# Patient Record
Sex: Male | Born: 2005 | Hispanic: Yes | Marital: Single | Smoking: Never smoker
Health system: Southern US, Community
[De-identification: ages and names within clinical notes are randomized; demographics above are authoritative.]

## PROBLEM LIST (undated history)

## (undated) DIAGNOSIS — R569 Unspecified convulsions: Secondary | ICD-10-CM

## (undated) DIAGNOSIS — A419 Sepsis, unspecified organism: Secondary | ICD-10-CM

## (undated) DIAGNOSIS — N189 Chronic kidney disease, unspecified: Secondary | ICD-10-CM

## (undated) DIAGNOSIS — D65 Disseminated intravascular coagulation [defibrination syndrome]: Secondary | ICD-10-CM

## (undated) DIAGNOSIS — Q627 Congenital vesico-uretero-renal reflux: Secondary | ICD-10-CM

## (undated) HISTORY — DX: Unspecified convulsions: R56.9

## (undated) HISTORY — PX: SKIN GRAFT: SHX250

## (undated) HISTORY — DX: Chronic kidney disease, unspecified: N18.9

## (undated) HISTORY — DX: Disseminated intravascular coagulation (defibrination syndrome): D65

---
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
## 2023-02-05 ENCOUNTER — Other Ambulatory Visit: Payer: Self-pay

## 2023-02-05 ENCOUNTER — Other Ambulatory Visit (HOSPITAL_BASED_OUTPATIENT_CLINIC_OR_DEPARTMENT_OTHER): Payer: Self-pay

## 2023-02-05 ENCOUNTER — Emergency Department (HOSPITAL_BASED_OUTPATIENT_CLINIC_OR_DEPARTMENT_OTHER): Payer: Medicaid Other

## 2023-02-05 ENCOUNTER — Emergency Department (HOSPITAL_BASED_OUTPATIENT_CLINIC_OR_DEPARTMENT_OTHER)
Admission: EM | Admit: 2023-02-05 | Discharge: 2023-02-05 | Disposition: A | Discharge status: Home / Self Care | Payer: Medicaid Other | Attending: Emergency Medicine | Admitting: Emergency Medicine

## 2023-02-05 ENCOUNTER — Encounter (HOSPITAL_BASED_OUTPATIENT_CLINIC_OR_DEPARTMENT_OTHER): Payer: Self-pay | Admitting: Emergency Medicine

## 2023-02-05 DIAGNOSIS — N189 Chronic kidney disease, unspecified: Secondary | ICD-10-CM | POA: Insufficient documentation

## 2023-02-05 DIAGNOSIS — R1110 Vomiting, unspecified: Secondary | ICD-10-CM | POA: Diagnosis not present

## 2023-02-05 DIAGNOSIS — R109 Unspecified abdominal pain: Secondary | ICD-10-CM | POA: Diagnosis not present

## 2023-02-05 DIAGNOSIS — K529 Noninfective gastroenteritis and colitis, unspecified: Secondary | ICD-10-CM

## 2023-02-05 DIAGNOSIS — R112 Nausea with vomiting, unspecified: Secondary | ICD-10-CM | POA: Diagnosis present

## 2023-02-05 LAB — URINALYSIS, ROUTINE W REFLEX MICROSCOPIC
Bilirubin Urine: NEGATIVE
Glucose, UA: NEGATIVE mg/dL
Hgb urine dipstick: NEGATIVE
Ketones, ur: NEGATIVE mg/dL
Leukocytes,Ua: NEGATIVE
Nitrite: NEGATIVE
Protein, ur: 30 mg/dL — AB
Specific Gravity, Urine: 1.02 (ref 1.005–1.030)
pH: 7 (ref 5.0–8.0)

## 2023-02-05 LAB — CBC WITH DIFFERENTIAL/PLATELET
Abs Immature Granulocytes: 0.03 10*3/uL (ref 0.00–0.07)
Basophils Absolute: 0 10*3/uL (ref 0.0–0.1)
Basophils Relative: 0 %
Eosinophils Absolute: 0.1 10*3/uL (ref 0.0–0.5)
Eosinophils Relative: 1 %
HCT: 49.5 % (ref 39.0–52.0)
Hemoglobin: 16.4 g/dL (ref 13.0–17.0)
Immature Granulocytes: 0 %
Lymphocytes Relative: 3 %
Lymphs Abs: 0.3 10*3/uL — ABNORMAL LOW (ref 0.7–4.0)
MCH: 29.1 pg (ref 26.0–34.0)
MCHC: 33.1 g/dL (ref 30.0–36.0)
MCV: 87.9 fL (ref 80.0–100.0)
Monocytes Absolute: 0.6 10*3/uL (ref 0.1–1.0)
Monocytes Relative: 5 %
Neutro Abs: 11.3 10*3/uL — ABNORMAL HIGH (ref 1.7–7.7)
Neutrophils Relative %: 91 %
Platelets: 230 10*3/uL (ref 150–400)
RBC: 5.63 MIL/uL (ref 4.22–5.81)
RDW: 13.5 % (ref 11.5–15.5)
WBC: 12.3 10*3/uL — ABNORMAL HIGH (ref 4.0–10.5)
nRBC: 0 % (ref 0.0–0.2)

## 2023-02-05 LAB — COMPREHENSIVE METABOLIC PANEL
ALT: 38 U/L (ref 0–44)
AST: 19 U/L (ref 15–41)
Albumin: 5.1 g/dL — ABNORMAL HIGH (ref 3.5–5.0)
Alkaline Phosphatase: 81 U/L (ref 38–126)
Anion gap: 11 (ref 5–15)
BUN: 21 mg/dL — ABNORMAL HIGH (ref 6–20)
CO2: 25 mmol/L (ref 22–32)
Calcium: 10.3 mg/dL (ref 8.9–10.3)
Chloride: 103 mmol/L (ref 98–111)
Creatinine, Ser: 1.06 mg/dL (ref 0.61–1.24)
GFR, Estimated: >60 mL/min (ref >60)
Glucose, Bld: 152 mg/dL — ABNORMAL HIGH (ref 70–99)
Potassium: 4 mmol/L (ref 3.5–5.1)
Sodium: 139 mmol/L (ref 135–145)
Total Bilirubin: 0.7 mg/dL (ref 0.3–1.2)
Total Protein: 8.9 g/dL — ABNORMAL HIGH (ref 6.5–8.1)

## 2023-02-05 LAB — URINALYSIS, MICROSCOPIC (REFLEX)

## 2023-02-05 LAB — LIPASE, BLOOD: Lipase: <10 U/L — ABNORMAL LOW (ref 11–51)

## 2023-02-05 MED ORDER — KETOROLAC TROMETHAMINE 15 MG/ML IJ SOLN
15.0000 mg | Freq: Once | INTRAMUSCULAR | Status: AC
  Administered 2023-02-05: 15 mg via INTRAVENOUS
  Filled 2023-02-05: qty 1

## 2023-02-05 MED ORDER — ONDANSETRON HCL 4 MG/2ML IJ SOLN
4.0000 mg | Freq: Once | INTRAMUSCULAR | Status: AC
  Administered 2023-02-05: 4 mg via INTRAVENOUS
  Filled 2023-02-05: qty 2

## 2023-02-05 MED ORDER — ONDANSETRON HCL 4 MG PO TABS: 4.0000 mg | ORAL_TABLET | Freq: Four times a day (QID) | ORAL | 0 refills | Status: DC

## 2023-02-05 MED ORDER — SODIUM CHLORIDE 0.9 % IV BOLUS
1000.0000 mL | Freq: Once | INTRAVENOUS | Status: AC
  Administered 2023-02-05: 1000 mL via INTRAVENOUS

## 2023-02-05 MED ORDER — ONDANSETRON HCL 4 MG PO TABS
4.0000 mg | ORAL_TABLET | Freq: Four times a day (QID) | ORAL | 0 refills | Status: DC
  Filled 2023-02-05: qty 12, 3d supply, fill #0

## 2023-02-05 MED ORDER — IOHEXOL 300 MG/ML  SOLN
100.0000 mL | Freq: Once | INTRAMUSCULAR | Status: AC | PRN
  Administered 2023-02-05: 85 mL via INTRAVENOUS

## 2023-02-05 MED ORDER — MORPHINE SULFATE (PF) 4 MG/ML IV SOLN
4.0000 mg | Freq: Once | INTRAVENOUS | Status: DC
  Filled 2023-02-05: qty 1

## 2023-02-05 NOTE — ED Notes (Signed)
Patient transported to CT 
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
## 2023-02-05 NOTE — Discharge Instructions (Signed)
We evaluated you for your nausea, vomiting, diarrhea.  Your laboratory testing and CT scan did not show any dangerous process.  Your symptoms improved with IV fluids and nausea medication.  Your symptoms are most likely due to a viral illness.  I have prescribed you nausea medication to take at home.  Please take this every 6 hours as needed for nausea or vomiting.  You can also buy Imodium over-the-counter to help with your diarrhea.  Please be sure to drink a lot of fluids.  Please return to the emergency department if you are unable to keep down fluids, you develop worsening pain, high fevers, bloody stools, bloody vomit, or any other concerning symptoms.

## 2023-02-05 NOTE — ED Notes (Signed)
Dc instructions reviewed with patient. Patient voiced understanding. Dc with belongings.  °

## 2023-02-05 NOTE — ED Triage Notes (Signed)
Pt has been vomiting since 10pm last night- unable to keep any meds down. Epigastric pain and Diarrhea.

## 2024-02-10 ENCOUNTER — Encounter (HOSPITAL_BASED_OUTPATIENT_CLINIC_OR_DEPARTMENT_OTHER): Payer: Self-pay | Admitting: Family Medicine

## 2024-02-10 ENCOUNTER — Ambulatory Visit (INDEPENDENT_AMBULATORY_CARE_PROVIDER_SITE_OTHER): Payer: Medicaid Other | Admitting: Family Medicine

## 2024-02-10 VITALS — BP 133/81 | HR 81 | Ht 67.0 in | Wt 266.4 lb

## 2024-02-10 DIAGNOSIS — Z0000 Encounter for general adult medical examination without abnormal findings: Secondary | ICD-10-CM

## 2024-02-10 DIAGNOSIS — E559 Vitamin D deficiency, unspecified: Secondary | ICD-10-CM | POA: Diagnosis not present

## 2024-02-10 NOTE — Patient Instructions (Addendum)
  Medication Instructions:  Your physician recommends that you continue on your current medications as directed. Please refer to the Current Medication list given to you today. --If you need a refill on any your medications before your next appointment, please call your pharmacy first. If no refills are authorized on file call the office.-- Lab Work: Your physician has recommended that you have lab work today: 1 week before the next visit  If you have labs (blood work) drawn today and your tests are completely normal, you will receive your results via MyChart message OR a phone call from our staff.  Please ensure you check your voicemail in the event that you authorized detailed messages to be left on a delegated number. If you have any lab test that is abnormal or we need to change your treatment, we will call you to review the results.  Follow-Up: Your next appointment:   Your physician recommends that you schedule a follow-up appointment in: 1-2 month physical  with Dr. de [place]  You will receive a text message or e-mail with a link to a survey about your care and experience with [place] today! We would greatly appreciate your feedback!   Thanks for letting us be apart of your health journey!!  Primary Care and Sports Medicine   Dr. Ceasar Mons [place]   We encourage you to activate your patient portal called "MyChart".  Sign up information is provided on this After Visit Summary.  MyChart is used to connect with patients for Virtual Visits (Telemedicine).  Patients are able to view lab/test results, encounter notes, upcoming appointments, etc.  Non-urgent messages can be sent to your provider as well. To learn more about what you can do with MyChart, please visit --  [email or web address].
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
## 2024-02-10 NOTE — Assessment & Plan Note (Signed)
 Observed on prior labs.  Can monitor this on upcoming labs in order to reassess

## 2024-04-08 ENCOUNTER — Other Ambulatory Visit (HOSPITAL_BASED_OUTPATIENT_CLINIC_OR_DEPARTMENT_OTHER)

## 2024-04-08 DIAGNOSIS — E559 Vitamin D deficiency, unspecified: Secondary | ICD-10-CM

## 2024-04-08 DIAGNOSIS — Z0000 Encounter for general adult medical examination without abnormal findings: Secondary | ICD-10-CM | POA: Diagnosis not present

## 2024-04-09 LAB — COMPREHENSIVE METABOLIC PANEL WITH GFR
ALT: 40 IU/L (ref 0–44)
AST: 26 IU/L (ref 0–40)
Albumin: 4.9 g/dL (ref 4.3–5.2)
Alkaline Phosphatase: 83 IU/L (ref 51–125)
BUN/Creatinine Ratio: 16 (ref 9–20)
BUN: 16 mg/dL (ref 6–20)
Bilirubin Total: 0.7 mg/dL (ref 0.0–1.2)
CO2: 23 mmol/L (ref 20–29)
Calcium: 10.2 mg/dL (ref 8.7–10.2)
Chloride: 100 mmol/L (ref 96–106)
Creatinine, Ser: 0.98 mg/dL (ref 0.76–1.27)
Globulin, Total: 2.7 g/dL (ref 1.5–4.5)
Glucose: 87 mg/dL (ref 70–99)
Potassium: 3.9 mmol/L (ref 3.5–5.2)
Sodium: 139 mmol/L (ref 134–144)
Total Protein: 7.6 g/dL (ref 6.0–8.5)
eGFR: 114 mL/min/{1.73_m2} (ref >59)

## 2024-04-09 LAB — LIPID PANEL
Chol/HDL Ratio: 4.1 ratio (ref 0.0–5.0)
Cholesterol, Total: 171 mg/dL — ABNORMAL HIGH (ref 100–169)
HDL: 42 mg/dL (ref >39)
LDL Chol Calc (NIH): 115 mg/dL — ABNORMAL HIGH (ref 0–109)
Triglycerides: 75 mg/dL (ref 0–89)
VLDL Cholesterol Cal: 14 mg/dL (ref 5–40)

## 2024-04-09 LAB — CBC WITH DIFFERENTIAL/PLATELET
Basophils Absolute: 0 10*3/uL (ref 0.0–0.2)
Basos: 0 %
EOS (ABSOLUTE): 0.1 10*3/uL (ref 0.0–0.4)
Eos: 1 %
Hematocrit: 49.8 % (ref 37.5–51.0)
Hemoglobin: 16.4 g/dL (ref 13.0–17.7)
Immature Grans (Abs): 0 10*3/uL (ref 0.0–0.1)
Immature Granulocytes: 0 %
Lymphocytes Absolute: 1.5 10*3/uL (ref 0.7–3.1)
Lymphs: 29 %
MCH: 29.7 pg (ref 26.6–33.0)
MCHC: 32.9 g/dL (ref 31.5–35.7)
MCV: 90 fL (ref 79–97)
Monocytes Absolute: 0.5 10*3/uL (ref 0.1–0.9)
Monocytes: 9 %
Neutrophils Absolute: 3.2 10*3/uL (ref 1.4–7.0)
Neutrophils: 61 %
Platelets: 230 10*3/uL (ref 150–450)
RBC: 5.53 x10E6/uL (ref 4.14–5.80)
RDW: 13.2 % (ref 11.6–15.4)
WBC: 5.3 10*3/uL (ref 3.4–10.8)

## 2024-04-09 LAB — HEMOGLOBIN A1C
Est. average glucose Bld gHb Est-mCnc: 111 mg/dL
Hgb A1c MFr Bld: 5.5 % (ref 4.8–5.6)

## 2024-04-09 LAB — TSH RFX ON ABNORMAL TO FREE T4: TSH: 1.19 u[IU]/mL (ref 0.450–4.500)

## 2024-04-09 LAB — VITAMIN D 25 HYDROXY (VIT D DEFICIENCY, FRACTURES): Vit D, 25-Hydroxy: 26 ng/mL — ABNORMAL LOW (ref 30.0–100.0)

## 2024-04-14 ENCOUNTER — Encounter (HOSPITAL_BASED_OUTPATIENT_CLINIC_OR_DEPARTMENT_OTHER): Admitting: Family Medicine

## 2024-06-29 ENCOUNTER — Encounter (HOSPITAL_BASED_OUTPATIENT_CLINIC_OR_DEPARTMENT_OTHER): Admitting: Family Medicine

## 2024-07-28 ENCOUNTER — Encounter (HOSPITAL_BASED_OUTPATIENT_CLINIC_OR_DEPARTMENT_OTHER): Payer: Self-pay | Admitting: Family Medicine

## 2024-07-28 ENCOUNTER — Ambulatory Visit (HOSPITAL_BASED_OUTPATIENT_CLINIC_OR_DEPARTMENT_OTHER): Admitting: Family Medicine

## 2024-07-28 VITALS — BP 126/86 | HR 70 | Ht 66.0 in | Wt 245.1 lb

## 2024-07-28 DIAGNOSIS — E559 Vitamin D deficiency, unspecified: Secondary | ICD-10-CM | POA: Diagnosis not present

## 2024-07-28 DIAGNOSIS — Z0000 Encounter for general adult medical examination without abnormal findings: Secondary | ICD-10-CM | POA: Diagnosis not present
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
## 2024-07-28 NOTE — Assessment & Plan Note (Signed)
 Seen on recent labs, advised on supplementation.

## 2024-07-28 NOTE — Patient Instructions (Signed)
  Medication Instructions:  Your physician recommends that you continue on your current medications as directed. Please refer to the Current Medication list given to you today. --If you need a refill on any your medications before your next appointment, please call your pharmacy first. If no refills are authorized on file call the office.-- Lab Work: Your physician has recommended that you have lab work today: 1 week before next visit If you have labs (blood work) drawn today and your tests are completely normal, you will receive your results via MyChart message OR a phone call from our staff.  Please ensure you check your voicemail in the event that you authorized detailed messages to be left on a delegated number. If you have any lab test that is abnormal or we need to change your treatment, we will call you to review the results.    Follow-Up: Your next appointment:   Your physician recommends that you schedule a follow-up appointment in: 1 year physical with Dr. de [place]  You will receive a text message or e-mail with a link to a survey about your care and experience with [place] today! We would greatly appreciate your feedback!   Thanks for letting us be apart of your health journey!!  Primary Care and Sports Medicine   Dr. Ceasar Mons [place]   We encourage you to activate your patient portal called "MyChart".  Sign up information is provided on this After Visit Summary.  MyChart is used to connect with patients for Virtual Visits (Telemedicine).  Patients are able to view lab/test results, encounter notes, upcoming appointments, etc.  Non-urgent messages can be sent to your provider as well. To learn more about what you can do with MyChart, please visit --  [email or web address].

## 2024-07-28 NOTE — Assessment & Plan Note (Signed)

## 2025-08-02 ENCOUNTER — Encounter (HOSPITAL_BASED_OUTPATIENT_CLINIC_OR_DEPARTMENT_OTHER): Admitting: Family Medicine
